# Patient Record
Sex: Female | Born: 1965 | Hispanic: No | Marital: Married | Smoking: Never smoker
Health system: Southern US, Community
[De-identification: ages and names within clinical notes are randomized; demographics above are authoritative.]

## PROBLEM LIST (undated history)

## (undated) DIAGNOSIS — F329 Major depressive disorder, single episode, unspecified: Secondary | ICD-10-CM

## (undated) DIAGNOSIS — F419 Anxiety disorder, unspecified: Secondary | ICD-10-CM

## (undated) DIAGNOSIS — F32A Depression, unspecified: Secondary | ICD-10-CM

## (undated) DIAGNOSIS — T7840XA Allergy, unspecified, initial encounter: Secondary | ICD-10-CM

## (undated) HISTORY — PX: ABDOMINAL HYSTERECTOMY: SHX81

## (undated) HISTORY — DX: Allergy, unspecified, initial encounter: T78.40XA

## (undated) HISTORY — PX: BREAST SURGERY: SHX581

## (undated) HISTORY — DX: Major depressive disorder, single episode, unspecified: F32.9

## (undated) HISTORY — DX: Depression, unspecified: F32.A

## (undated) HISTORY — PX: COSMETIC SURGERY: SHX468

## (undated) HISTORY — DX: Anxiety disorder, unspecified: F41.9

---

## 1997-05-26 HISTORY — PX: REDUCTION MAMMAPLASTY: SUR839

## 1999-09-02 ENCOUNTER — Other Ambulatory Visit: Admission: RE | Admit: 1999-09-02 | Discharge: 1999-09-02 | Payer: Self-pay | Admitting: Obstetrics and Gynecology

## 2001-06-21 ENCOUNTER — Ambulatory Visit (HOSPITAL_BASED_OUTPATIENT_CLINIC_OR_DEPARTMENT_OTHER): Admission: RE | Admit: 2001-06-21 | Discharge: 2001-06-22 | Payer: Self-pay | Admitting: Specialist

## 2006-01-30 ENCOUNTER — Ambulatory Visit: Payer: Self-pay | Admitting: Internal Medicine

## 2012-05-05 ENCOUNTER — Other Ambulatory Visit: Payer: Self-pay | Admitting: Family Medicine

## 2012-05-05 DIAGNOSIS — Z1231 Encounter for screening mammogram for malignant neoplasm of breast: Secondary | ICD-10-CM

## 2012-05-05 DIAGNOSIS — Z9889 Other specified postprocedural states: Secondary | ICD-10-CM

## 2012-05-27 ENCOUNTER — Ambulatory Visit
Admission: RE | Admit: 2012-05-27 | Discharge: 2012-05-27 | Disposition: A | Discharge status: Home / Self Care | Payer: BC Managed Care – PPO | Source: Ambulatory Visit | Attending: Family Medicine | Admitting: Family Medicine

## 2012-05-27 DIAGNOSIS — Z9889 Other specified postprocedural states: Secondary | ICD-10-CM

## 2012-05-27 DIAGNOSIS — Z1231 Encounter for screening mammogram for malignant neoplasm of breast: Secondary | ICD-10-CM

## 2012-05-27 LAB — HM MAMMOGRAPHY: HM Mammogram: NORMAL

## 2012-06-04 ENCOUNTER — Encounter: Payer: Self-pay | Admitting: Family Medicine

## 2012-06-04 ENCOUNTER — Ambulatory Visit (INDEPENDENT_AMBULATORY_CARE_PROVIDER_SITE_OTHER): Payer: BC Managed Care – PPO | Admitting: Family Medicine

## 2012-06-04 VITALS — BP 102/80 | HR 84 | Temp 99.0°F | Resp 16 | Ht 62.0 in | Wt 183.0 lb

## 2012-06-04 DIAGNOSIS — Z0000 Encounter for general adult medical examination without abnormal findings: Secondary | ICD-10-CM

## 2012-06-04 DIAGNOSIS — G43909 Migraine, unspecified, not intractable, without status migrainosus: Secondary | ICD-10-CM

## 2012-06-04 DIAGNOSIS — J40 Bronchitis, not specified as acute or chronic: Secondary | ICD-10-CM

## 2012-06-04 DIAGNOSIS — R102 Pelvic and perineal pain: Secondary | ICD-10-CM

## 2012-06-04 DIAGNOSIS — K219 Gastro-esophageal reflux disease without esophagitis: Secondary | ICD-10-CM

## 2012-06-04 DIAGNOSIS — G4700 Insomnia, unspecified: Secondary | ICD-10-CM

## 2012-06-04 LAB — CBC WITH DIFFERENTIAL/PLATELET
Basophils Absolute: 0 10*3/uL (ref 0.0–0.1)
Eosinophils Relative: 4 % (ref 0–5)
HCT: 39.6 % (ref 36.0–46.0)
Lymphocytes Relative: 31 % (ref 12–46)
MCV: 90.8 fL (ref 78.0–100.0)
Monocytes Absolute: 0.8 10*3/uL (ref 0.1–1.0)
Monocytes Relative: 8 % (ref 3–12)
RDW: 12.6 % (ref 11.5–15.5)
WBC: 9.5 10*3/uL (ref 4.0–10.5)

## 2012-06-04 LAB — COMPREHENSIVE METABOLIC PANEL
Albumin: 4.2 g/dL (ref 3.5–5.2)
Alkaline Phosphatase: 43 U/L (ref 39–117)
BUN: 17 mg/dL (ref 6–23)
CO2: 21 mEq/L (ref 19–32)
Calcium: 9.1 mg/dL (ref 8.4–10.5)
Chloride: 110 mEq/L (ref 96–112)
Glucose, Bld: 98 mg/dL (ref 70–99)
Potassium: 4.3 mEq/L (ref 3.5–5.3)

## 2012-06-04 LAB — POCT URINALYSIS DIPSTICK
Glucose, UA: NEGATIVE
Leukocytes, UA: NEGATIVE
Nitrite, UA: NEGATIVE
Protein, UA: NEGATIVE
Urobilinogen, UA: 0.2

## 2012-06-04 LAB — LIPID PANEL
HDL: 37 mg/dL — ABNORMAL LOW (ref >39)
Triglycerides: 109 mg/dL (ref <150)

## 2012-06-04 MED ORDER — OMEPRAZOLE 40 MG PO CPDR: 40.0000 mg | DELAYED_RELEASE_CAPSULE | Freq: Every day | ORAL | Status: AC

## 2012-06-04 MED ORDER — AZITHROMYCIN 250 MG PO TABS: ORAL_TABLET | ORAL | Status: AC

## 2012-06-04 MED ORDER — BENZONATATE 200 MG PO CAPS: 200.0000 mg | ORAL_CAPSULE | Freq: Three times a day (TID) | ORAL | Status: AC | PRN

## 2012-06-04 NOTE — Progress Notes (Signed)
Subjective:    Patient ID: Tracey Holland, female    DOB: Jun 21, 1965, 47 y.o.   MRN: [id number] Chief Complaint  Patient presents with  . Annual Exam    w/o pap    HPI  Had hysterectomy for metrmenorrhagia at 47 yo - so severe she had to have transfusions.  Had blood discharge. Resolved. Did have 1d of rlq/adnexal pain. Has appt w/ gyn next wk.  Has had a cold for the past few wks w/ some cong which has moved into chest. Nasal cong prod of clear mucous and sinus congestion, has some bilateral sinus pressure. Now w/ some cough at night. Wonders if she is going to go through menopause - starting to get hot flashes. Vs f/c from URI  Did not get flu shot this yr. As last yr she got the worst case of the flu that she ever got.  Had her arm swell up.  Drinking a lot of milk.    Does have a lot of heartburn and indigestion.  Past Medical History  Diagnosis Date  . Allergy   . Depression   . Anxiety    Past Surgical History  Procedure Date  . Breast surgery     reduction 15 yrs ago  . Cosmetic surgery     tummy tuck (lipo)  . Abdominal hysterectomy    Current Outpatient Prescriptions on File Prior to Visit  Medication Sig Dispense Refill  . clonazePAM (KLONOPIN) 0.5 MG tablet Take 0.5 mg by mouth as needed.      Marland Kitchen imipramine (TOFRANIL) 25 MG tablet Take 25 mg by mouth at bedtime.      . topiramate (TOPAMAX) 200 MG tablet Take 200 mg by mouth 2 (two) times daily.       Family History  Problem Relation Age of Onset  . Diabetes Mother   . Cancer Mother     breast (both) and lung  . Heart disease Father     heart attack  . Cancer Father 36 yo    colon  . Cancer Maternal Grandmother     lung and breast  . Stroke Paternal Grandmother   . Heart disease Paternal Grandfather      Review of Systems  Constitutional: Negative.   HENT: Positive for hearing loss, congestion, sneezing, sinus pressure and tinnitus.   Eyes:       Eye exam scheduled  Respiratory: Positive for cough  and chest tightness.   Cardiovascular: Negative.   Gastrointestinal: Negative.   Genitourinary: Positive for dysuria and dyspareunia.  Musculoskeletal: Negative.   Skin: Negative.   Neurological: Positive for numbness and headaches.       Chronic migraine  Hematological: Negative.   Psychiatric/Behavioral: Negative.      BP 102/80  Pulse 84  Temp(Src) 99 F (37.2 C) (Oral)  Resp 16  Ht 5\' 2"  (1.575 m)  Wt 183 lb (83.008 kg)  BMI 33.46 kg/m2  SpO2 100% Objective:   Physical Exam        Results for orders placed in visit on 06/04/12  POCT URINALYSIS DIPSTICK      Component Value Range   Color, UA yellow     Clarity, UA clear     Glucose, UA neg     Bilirubin, UA neg     Ketones, UA neg     Spec Grav, UA 1.025     Blood, UA neg     pH, UA 5.5     Protein, UA neg  Urobilinogen, UA 0.2     Nitrite, UA neg     Leukocytes, UA Negative      Assessment & Plan:  Migraine - Have copy of labs sent to neurology - [facility] phone # [phone] Dr. Darrow Bussing  Insomnia  Bronchitis - Plan: azithromycin (ZITHROMAX) 250 MG tablet, benzonatate (TESSALON) 200 MG capsule  Routine general medical examination at a health care facility - Plan: Ambulatory referral to Gastroenterology - Refer to GI for colonscopy due to +FHx., Lipid panel, CBC with Differential, TSH, Comprehensive metabolic panel, POCT urinalysis dipstick.  Has mammogram yearly  Pelvic pain in female - Have copy of labs sent to pt's gyn [facility]  GERD (gastroesophageal reflux disease) - Plan: omeprazole (PRILOSEC) 40 MG capsule  Meds ordered this encounter  Medications  . imipramine (TOFRANIL) 25 MG tablet    Sig: Take 25 mg by mouth at bedtime.  . topiramate (TOPAMAX) 200 MG tablet    Sig: Take 200 mg by mouth 2 (two) times daily.  Marland Kitchen DISCONTD: atenolol (TENORMIN) 100 MG tablet    Sig: Take 100 mg by mouth daily.  . clonazePAM (KLONOPIN) 0.5 MG tablet      Sig: Take 0.5 mg by mouth as needed.  . methocarbamol (ROBAXIN) 500 MG tablet    Sig: Take 500 mg by mouth 3 (three) times daily.  Marland Kitchen azithromycin (ZITHROMAX) 250 MG tablet    Sig: Take 2 tabs PO x 1 dose, then 1 tab PO QD x 4 days    Dispense:  6 tablet    Refill:  0  . benzonatate (TESSALON) 200 MG capsule    Sig: Take 1 capsule (200 mg total) by mouth 3 (three) times daily as needed for cough.    Dispense:  40 capsule    Refill:  0  . omeprazole (PRILOSEC) 40 MG capsule    Sig: Take 1 capsule (40 mg total) by mouth daily.    Dispense:  30 capsule    Refill:  3

## 2012-06-08 ENCOUNTER — Encounter: Payer: Self-pay | Admitting: *Deleted

## 2012-07-10 ENCOUNTER — Other Ambulatory Visit: Payer: Self-pay

## 2013-03-31 ENCOUNTER — Other Ambulatory Visit: Payer: Self-pay

## 2013-04-28 ENCOUNTER — Other Ambulatory Visit: Payer: Self-pay

## 2013-04-28 DIAGNOSIS — Z1231 Encounter for screening mammogram for malignant neoplasm of breast: Secondary | ICD-10-CM

## 2013-06-02 ENCOUNTER — Ambulatory Visit
Admission: RE | Admit: 2013-06-02 | Discharge: 2013-06-02 | Disposition: A | Discharge status: Home / Self Care | Payer: BC Managed Care – PPO | Source: Ambulatory Visit

## 2013-06-02 DIAGNOSIS — Z1231 Encounter for screening mammogram for malignant neoplasm of breast: Secondary | ICD-10-CM

## 2013-06-07 ENCOUNTER — Other Ambulatory Visit: Payer: Self-pay | Admitting: Family Medicine

## 2013-06-07 DIAGNOSIS — R928 Other abnormal and inconclusive findings on diagnostic imaging of breast: Secondary | ICD-10-CM

## 2013-06-17 ENCOUNTER — Ambulatory Visit
Admission: RE | Admit: 2013-06-17 | Discharge: 2013-06-17 | Disposition: A | Discharge status: Home / Self Care | Payer: BC Managed Care – PPO | Source: Ambulatory Visit | Attending: Family Medicine | Admitting: Family Medicine

## 2013-06-17 DIAGNOSIS — R928 Other abnormal and inconclusive findings on diagnostic imaging of breast: Secondary | ICD-10-CM

## 2014-01-11 ENCOUNTER — Other Ambulatory Visit (HOSPITAL_BASED_OUTPATIENT_CLINIC_OR_DEPARTMENT_OTHER): Payer: Self-pay | Admitting: Emergency Medicine

## 2014-01-11 ENCOUNTER — Encounter (HOSPITAL_BASED_OUTPATIENT_CLINIC_OR_DEPARTMENT_OTHER): Payer: Self-pay

## 2014-01-11 ENCOUNTER — Ambulatory Visit (HOSPITAL_BASED_OUTPATIENT_CLINIC_OR_DEPARTMENT_OTHER)
Admission: RE | Admit: 2014-01-11 | Discharge: 2014-01-11 | Disposition: A | Discharge status: Home / Self Care | Payer: BC Managed Care – PPO | Source: Ambulatory Visit | Attending: Emergency Medicine | Admitting: Emergency Medicine

## 2014-01-11 DIAGNOSIS — R10829 Rebound abdominal tenderness, unspecified site: Secondary | ICD-10-CM

## 2014-01-11 DIAGNOSIS — R1031 Right lower quadrant pain: Secondary | ICD-10-CM

## 2014-01-11 DIAGNOSIS — R109 Unspecified abdominal pain: Secondary | ICD-10-CM

## 2014-01-11 DIAGNOSIS — K3580 Unspecified acute appendicitis: Secondary | ICD-10-CM

## 2014-01-11 DIAGNOSIS — R10819 Abdominal tenderness, unspecified site: Secondary | ICD-10-CM | POA: Insufficient documentation

## 2014-01-11 MED ORDER — IOHEXOL 300 MG/ML  SOLN
100.0000 mL | Freq: Once | INTRAMUSCULAR | Status: AC | PRN
  Administered 2014-01-11: 100 mL via INTRAVENOUS

## 2014-03-23 IMAGING — MG MM DIGITAL SCREENING BILAT
4 series · 4 of 4 positions shown · non-contrast
Comparison: 08/10/2007 from [REDACTED]

***ADDENDUM*** CREATED: 06/01/2012 [DATE]
CLINICAL DATA: Screening.

DIGITAL BILATERAL SCREENING MAMMOGRAM WITH CAD
BILATERAL DIGITAL SCREENING MAMMOGRAM WITH CAD

[R CC]
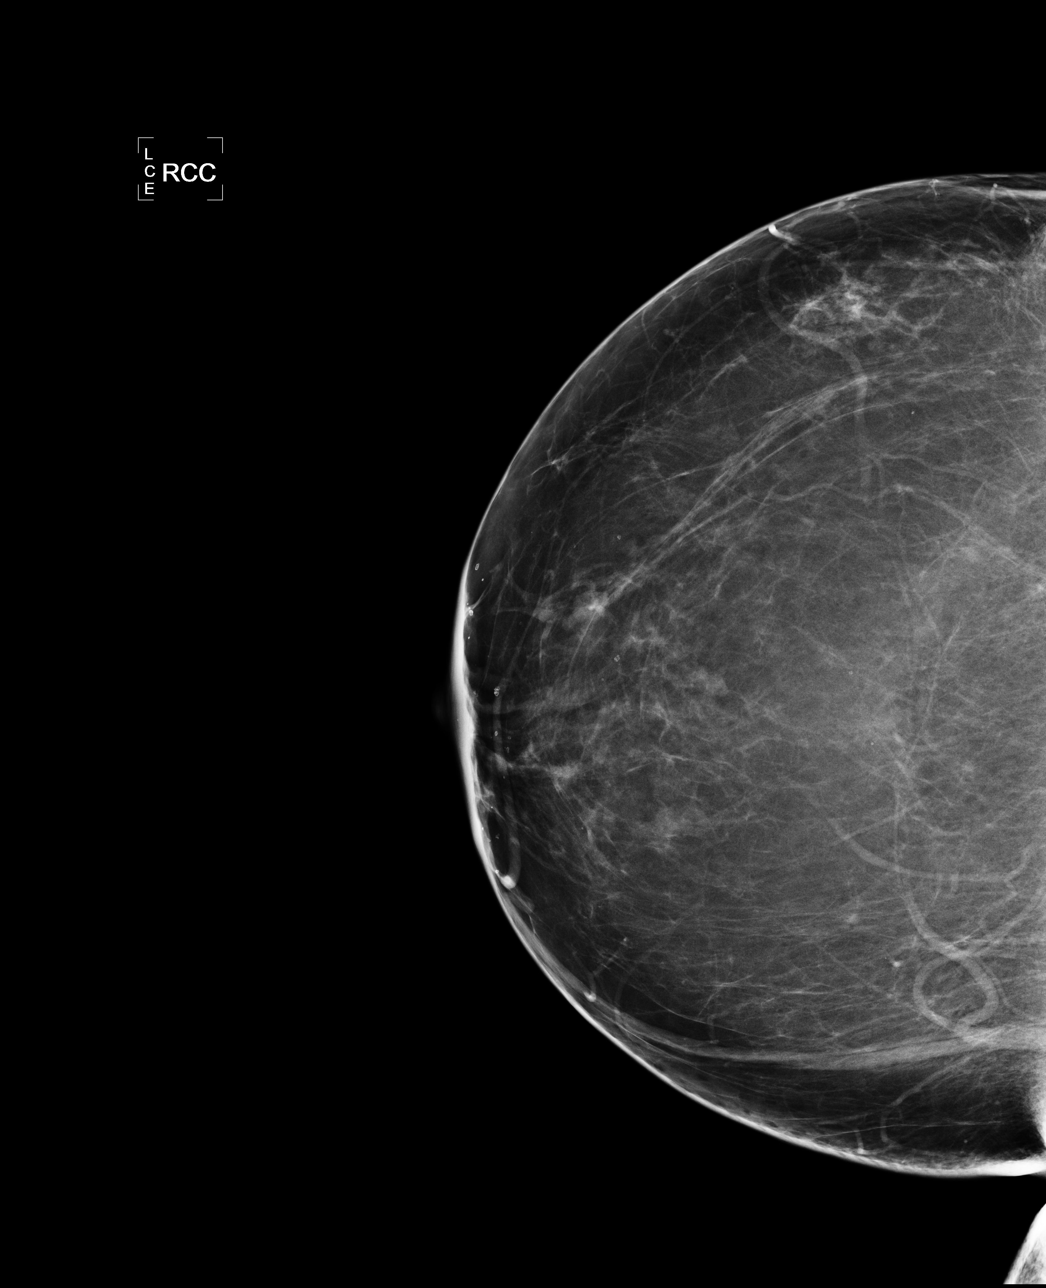

[L CC]
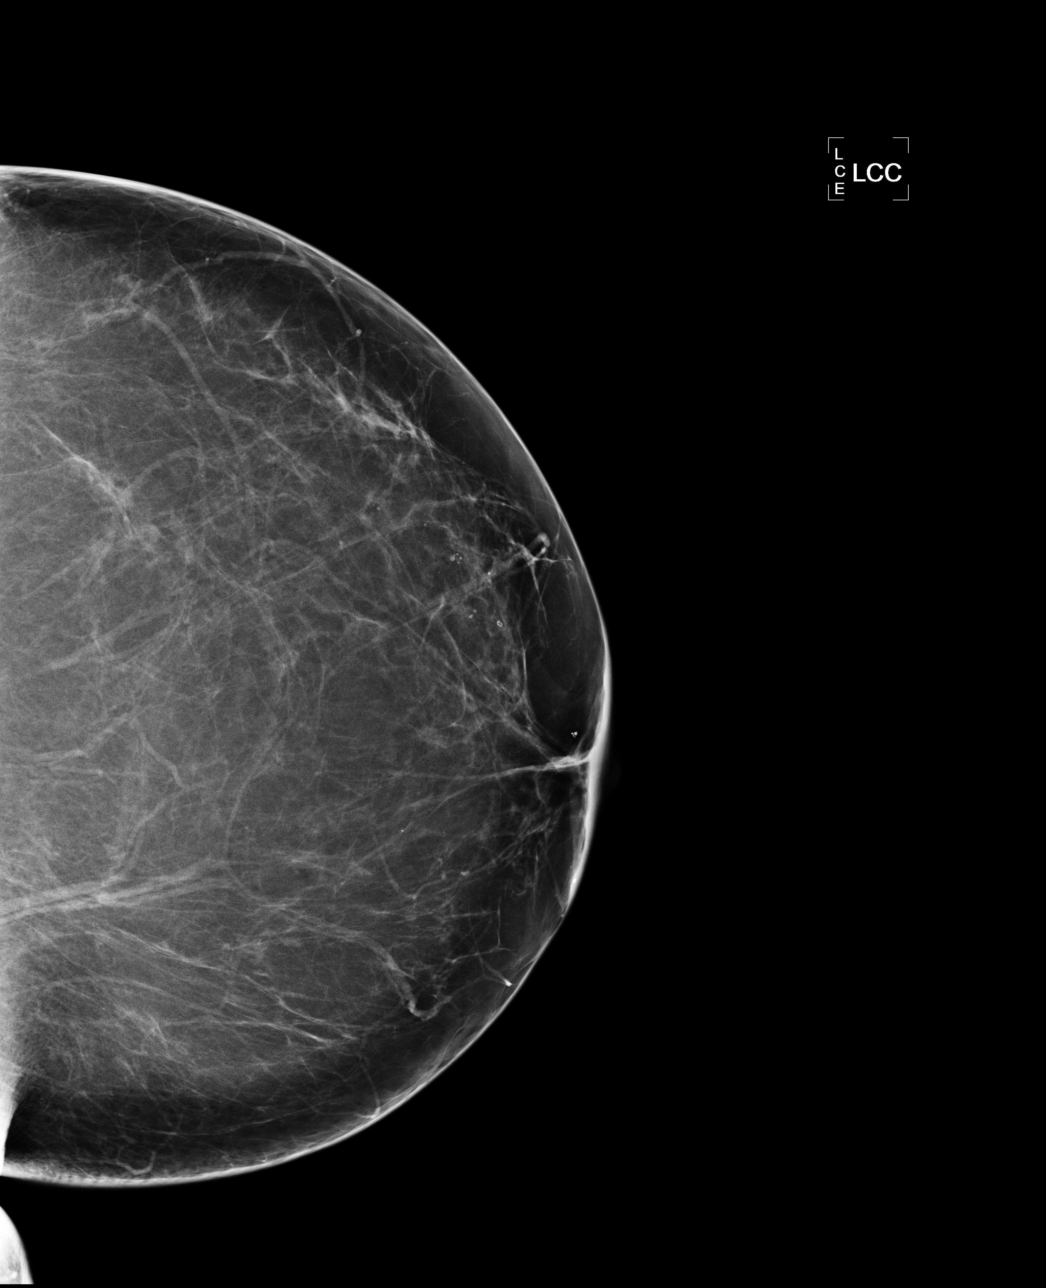

[L MLO]
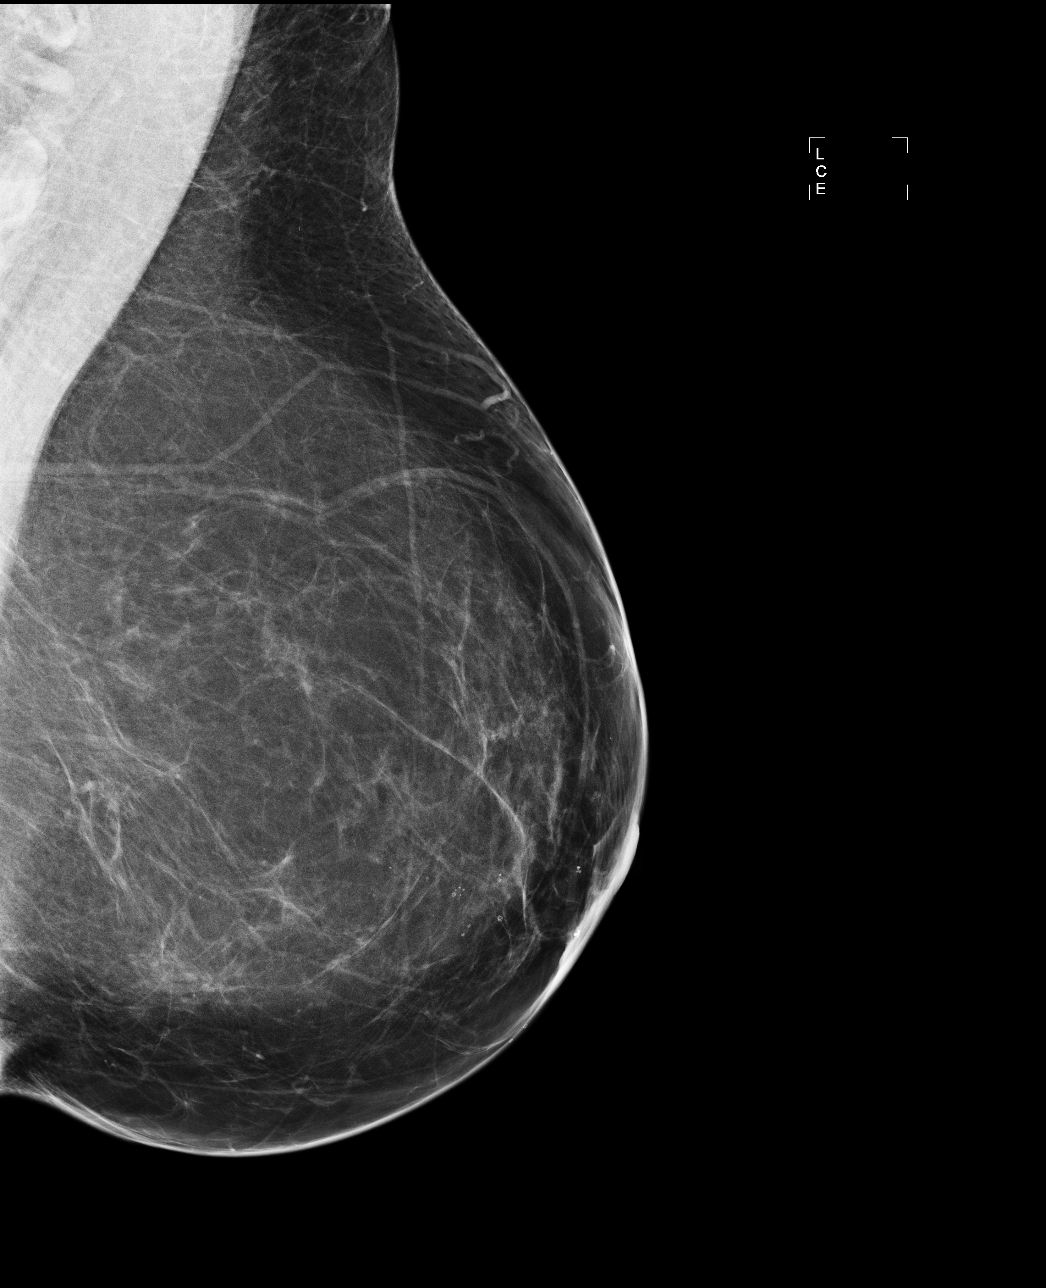

[R MLO]
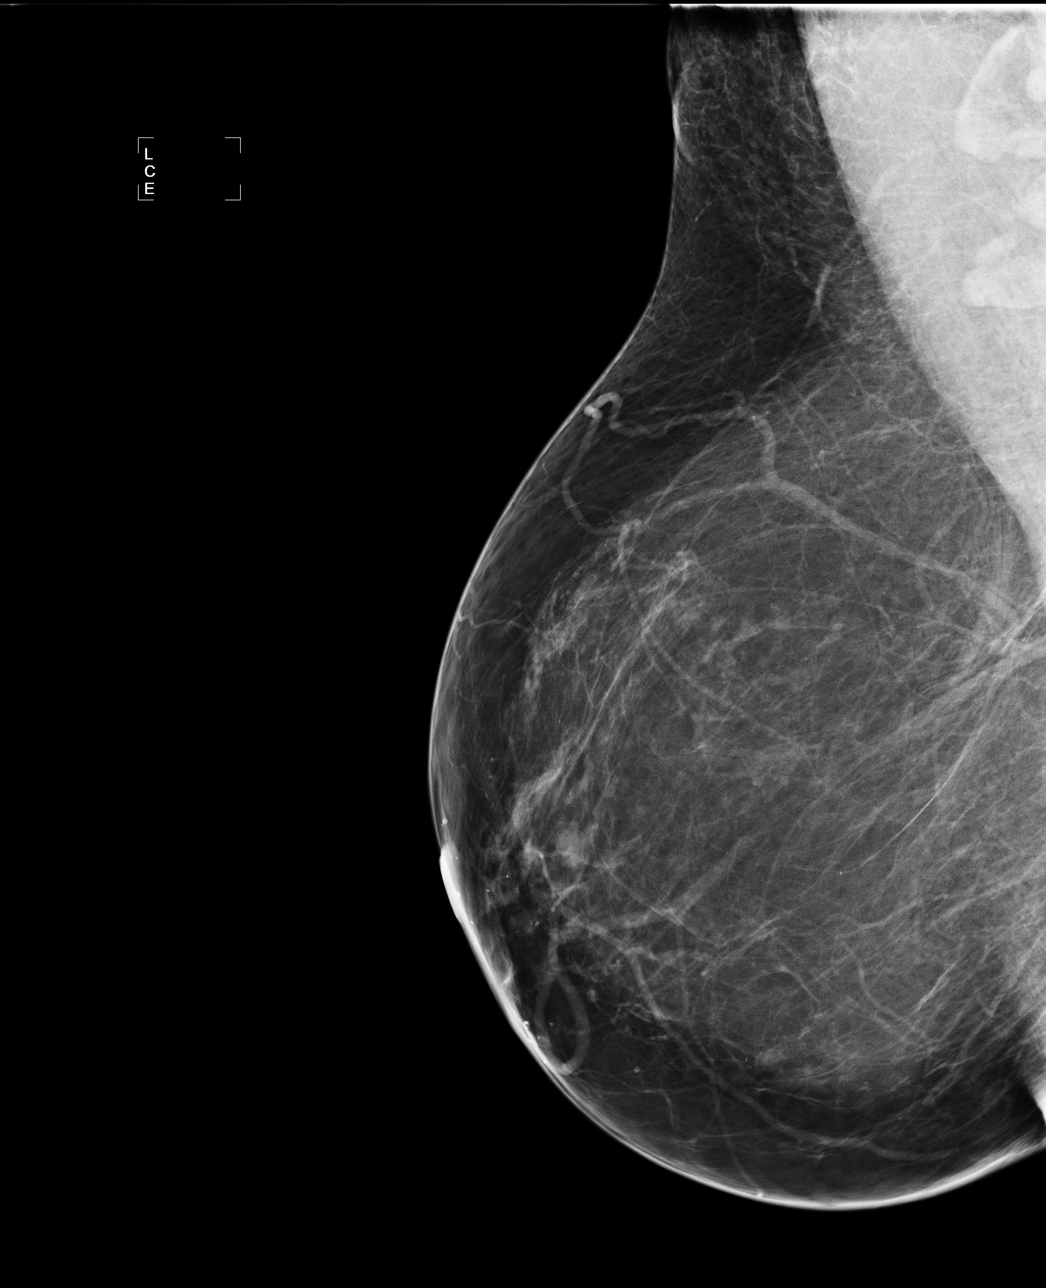

[4 of 4 positions shown; findings below may reference images not displayed]

FINDINGS: ACR Breast Density Category 1: The breast tissue is almost entirely
fatty.

There is no suspicious dominant mass, architectural distortion, or
calcification to suggest malignancy. Reduction mammoplasty changes
are present.

Images were processed with CAD.
IMPRESSION: No mammographic evidence of malignancy.

A result letter of this screening mammogram will be mailed directly
to the patient.

RECOMMENDATION:
Screening mammogram in one year. (Code:76-W-KT0)

BI-RADS CATEGORY 1:  Negative. Addended by:  Azaares Bovil, M.D.
on 06/01/2012 [DATE].

***END ADDENDUM*** SIGNED BY: Macho Tiger, M.D.
FINDINGS: Prior films are needed for interpretation.
IMPRESSION: Prior exams will be requested for comparison.  Following comparison
with prior studies an addendum will be made regarding further
recommendations.

Images were processed with CAD.

BI-RADS CATEGORY 0:  Incomplete.  Need additional imaging
evaluation and/or prior mammograms for comparison.

## 2014-06-01 ENCOUNTER — Other Ambulatory Visit: Payer: Self-pay

## 2014-06-01 DIAGNOSIS — Z1231 Encounter for screening mammogram for malignant neoplasm of breast: Secondary | ICD-10-CM

## 2014-06-08 ENCOUNTER — Ambulatory Visit
Admission: RE | Admit: 2014-06-08 | Discharge: 2014-06-08 | Disposition: A | Discharge status: Home / Self Care | Payer: BLUE CROSS/BLUE SHIELD | Source: Ambulatory Visit

## 2014-06-08 DIAGNOSIS — Z1231 Encounter for screening mammogram for malignant neoplasm of breast: Secondary | ICD-10-CM

## 2015-06-21 ENCOUNTER — Other Ambulatory Visit: Payer: Self-pay

## 2015-06-21 DIAGNOSIS — Z1231 Encounter for screening mammogram for malignant neoplasm of breast: Secondary | ICD-10-CM

## 2015-07-09 ENCOUNTER — Ambulatory Visit
Admission: RE | Admit: 2015-07-09 | Discharge: 2015-07-09 | Disposition: A | Discharge status: Home / Self Care | Payer: BLUE CROSS/BLUE SHIELD | Source: Ambulatory Visit

## 2015-07-09 DIAGNOSIS — Z1231 Encounter for screening mammogram for malignant neoplasm of breast: Secondary | ICD-10-CM

## 2016-07-21 ENCOUNTER — Other Ambulatory Visit: Payer: Self-pay | Admitting: *Deleted

## 2016-07-21 DIAGNOSIS — Z1231 Encounter for screening mammogram for malignant neoplasm of breast: Secondary | ICD-10-CM

## 2016-08-05 ENCOUNTER — Ambulatory Visit
Admission: RE | Admit: 2016-08-05 | Discharge: 2016-08-05 | Disposition: A | Discharge status: Home / Self Care | Payer: BLUE CROSS/BLUE SHIELD | Source: Ambulatory Visit | Attending: *Deleted | Admitting: *Deleted

## 2016-08-05 DIAGNOSIS — Z1231 Encounter for screening mammogram for malignant neoplasm of breast: Secondary | ICD-10-CM

## 2017-09-22 ENCOUNTER — Other Ambulatory Visit: Payer: Self-pay | Admitting: *Deleted

## 2017-09-22 DIAGNOSIS — Z1231 Encounter for screening mammogram for malignant neoplasm of breast: Secondary | ICD-10-CM

## 2017-10-09 ENCOUNTER — Ambulatory Visit
Admission: RE | Admit: 2017-10-09 | Discharge: 2017-10-09 | Disposition: A | Discharge status: Home / Self Care | Payer: BLUE CROSS/BLUE SHIELD | Source: Ambulatory Visit | Attending: *Deleted | Admitting: *Deleted

## 2017-10-09 DIAGNOSIS — Z1231 Encounter for screening mammogram for malignant neoplasm of breast: Secondary | ICD-10-CM

## 2024-01-22 ENCOUNTER — Encounter (HOSPITAL_BASED_OUTPATIENT_CLINIC_OR_DEPARTMENT_OTHER): Payer: Self-pay | Admitting: Emergency Medicine

## 2024-01-22 ENCOUNTER — Emergency Department (HOSPITAL_BASED_OUTPATIENT_CLINIC_OR_DEPARTMENT_OTHER)

## 2024-01-22 ENCOUNTER — Emergency Department (HOSPITAL_BASED_OUTPATIENT_CLINIC_OR_DEPARTMENT_OTHER)
Admission: EM | Admit: 2024-01-22 | Discharge: 2024-01-22 | Disposition: A | Discharge status: Home / Self Care | Attending: Emergency Medicine | Admitting: Emergency Medicine

## 2024-01-22 ENCOUNTER — Other Ambulatory Visit: Payer: Self-pay

## 2024-01-22 DIAGNOSIS — M25461 Effusion, right knee: Secondary | ICD-10-CM | POA: Diagnosis present

## 2024-01-22 DIAGNOSIS — M1711 Unilateral primary osteoarthritis, right knee: Secondary | ICD-10-CM | POA: Insufficient documentation

## 2024-01-22 NOTE — Discharge Instructions (Signed)
 You were seen in the emergency department for knee swelling and knee pain. There was no evidence of blood clot on ultrasound The x-ray looks okay There is little swelling in the knee Most likely related to your osteoarthritis Follow-up with your orthopedic team and physical therapy team Return to the Emergency Department for severe pain or if you are unable to walk

## 2024-01-22 NOTE — ED Provider Notes (Signed)
 [facility] Provider Note   CSN: [id number] Arrival date & time: 01/22/24  1424     Patient presents with: Knee Pain and Joint Swelling   Tracey Holland is a 58 y.o. female.  With a history of osteoarthritis who presents the ED for right knee swelling.  Patient has experienced pain and swelling in her right knee for last 2 days.  She went to her PT appointment this morning and was concerned about the swelling of her right knee and they sent her here for further evaluation including to rule out DVT.  No fevers chills redness or inciting trauma she can recall    Knee Pain      Prior to Admission medications   Medication Sig Start Date End Date Taking? Authorizing Provider  azithromycin  (ZITHROMAX ) 250 MG tablet Take 2 tabs PO x 1 dose, then 1 tab PO QD x 4 days 06/04/12   Loreli Elyn SAILOR, MD  benzonatate  (TESSALON ) 200 MG capsule Take 1 capsule (200 mg total) by mouth 3 (three) times daily as needed for cough. 06/04/12   Loreli Elyn SAILOR, MD  clonazePAM (KLONOPIN) 0.5 MG tablet Take 0.5 mg by mouth as needed.    [provider]  imipramine (TOFRANIL) 25 MG tablet Take 25 mg by mouth at bedtime.    [provider]  methocarbamol (ROBAXIN) 500 MG tablet Take 500 mg by mouth 3 (three) times daily.    [provider]  omeprazole  (PRILOSEC) 40 MG capsule Take 1 capsule (40 mg total) by mouth daily. 06/04/12   Loreli Elyn SAILOR, MD  topiramate (TOPAMAX) 200 MG tablet Take 200 mg by mouth 2 (two) times daily.    [provider]    Allergies: Codeine, Penicillins, Strawberry extract, and Gabapentin    Review of Systems  Updated Vital Signs BP (!) 136/91 (BP Location: Right Arm)   Pulse 94   Temp 98.4 F (36.9 C) (Oral)   Resp 20   Ht 5' 3 (1.6 m)   Wt 98.9 kg   SpO2 100%   BMI 38.62 kg/m   Physical Exam Vitals and nursing note reviewed.  HENT:     Head: Normocephalic and atraumatic.  Eyes:     Pupils: Pupils  are equal, round, and reactive to light.  Cardiovascular:     Rate and Rhythm: Normal rate and regular rhythm.  Pulmonary:     Effort: Pulmonary effort is normal.     Breath sounds: Normal breath sounds.  Abdominal:     Palpations: Abdomen is soft.     Tenderness: There is no abdominal tenderness.  Musculoskeletal:     Comments: Mild joint effusion over superior lateral aspect of right knee with no overlying erythema increased warmth Full active range of motion at the right knee Some mild tenderness to palpation of the anterior right knee No calf swelling or tenderness No palpable cords 2+ DP pulses bilaterally with sensation intact light touch throughout bilateral lower extremities  Skin:    General: Skin is warm and dry.  Neurological:     Mental Status: She is alert.  Psychiatric:        Mood and Affect: Mood normal.     (all labs ordered are listed, but only abnormal results are displayed) Labs Reviewed - No data to display  EKG: None  Radiology: US  Venous Img Lower Unilateral Right Result Date: 01/22/2024 CLINICAL DATA:  Right lower extremity pain EXAM: Right LOWER EXTREMITY VENOUS DOPPLER  ULTRASOUND TECHNIQUE: Gray-scale sonography with compression, as well as color and duplex ultrasound, were performed to evaluate the deep venous system(s) from the level of the common femoral vein through the popliteal and proximal calf veins. COMPARISON:  None Available. FINDINGS: VENOUS Normal compressibility of the common femoral, superficial femoral, and popliteal veins, as well as the visualized calf veins. Visualized portions of profunda femoral vein and great saphenous vein unremarkable. No filling defects to suggest DVT on grayscale or color Doppler imaging. Doppler waveforms show normal direction of venous flow, normal respiratory plasticity and response to augmentation. Limited views of the contralateral common femoral vein are unremarkable. OTHER None. Limitations: none IMPRESSION:  Negative. Electronically Signed   By: Luke Bun M.D.   On: 01/22/2024 16:09   DG Knee Complete 4 Views Right Result Date: 01/22/2024 CLINICAL DATA:  Chronic right knee pain. EXAM: RIGHT KNEE - COMPLETE 4+ VIEW COMPARISON:  None Available. FINDINGS: No fracture or dislocation is noted. Mild narrowing and osteophyte formation of medial joint space is noted. Probable small suprapatellar joint effusion is noted. IMPRESSION: Mild degenerative joint disease is noted medially. Probable small suprapatellar joint effusion. No fracture or dislocation. Electronically Signed   By: Lynwood Landy Raddle M.D.   On: 01/22/2024 15:48     Procedures   Medications Ordered in the ED - No data to display                                  Medical Decision Making 58 year old female with history of bilateral knee osteoarthritis presenting for atraumatic knee swelling discomfort.  Sent in by recommendation of her PT today.  No osseous abnormality on x-ray.  Mild effusion.  No overlying erythema or increased warmth with full active range of motion.  Able to fully flex knee.  Low suspicion for septic arthritis.  No evidence of DVT on ultrasound.  Suspect effusion may be most likely due to osteoarthritis of the right knee.  Will discharge with instruction for Ortho and PT follow-up  Amount and/or Complexity of Data Reviewed Radiology: ordered.        Final diagnoses:  Effusion of right knee  Osteoarthritis of right knee, unspecified osteoarthritis type    ED Discharge Orders     None          Pamella Ozell LABOR, DO 01/22/24 1656

## 2024-01-22 NOTE — ED Triage Notes (Signed)
 Pt reports bone on bone knee pain x 1 year, states she has been working with Ortho and PT, went to PT this morning and noticed edema in the RT knee, called Ortho MD and wanted her to come r/o DVT
